# Patient Record
Sex: Male | Born: 1990 | Hispanic: No | Marital: Married | Smoking: Never smoker
Health system: Southern US, Community
[De-identification: ages and names within clinical notes are randomized; demographics above are authoritative.]

---

## 2020-03-12 ENCOUNTER — Emergency Department (HOSPITAL_COMMUNITY)
Admission: EM | Admit: 2020-03-12 | Discharge: 2020-03-13 | Disposition: A | Discharge status: Home / Self Care | Payer: PRIVATE HEALTH INSURANCE | Attending: Emergency Medicine | Admitting: Emergency Medicine

## 2020-03-12 ENCOUNTER — Encounter (HOSPITAL_COMMUNITY): Payer: Self-pay | Admitting: Emergency Medicine

## 2020-03-12 ENCOUNTER — Other Ambulatory Visit: Payer: Self-pay

## 2020-03-12 DIAGNOSIS — J0140 Acute pansinusitis, unspecified: Secondary | ICD-10-CM | POA: Diagnosis not present

## 2020-03-12 DIAGNOSIS — R519 Headache, unspecified: Secondary | ICD-10-CM | POA: Diagnosis present

## 2020-03-12 DIAGNOSIS — Z20822 Contact with and (suspected) exposure to covid-19: Secondary | ICD-10-CM | POA: Insufficient documentation

## 2020-03-12 LAB — URINALYSIS, ROUTINE W REFLEX MICROSCOPIC
Bilirubin Urine: NEGATIVE
Glucose, UA: NEGATIVE mg/dL
Hgb urine dipstick: NEGATIVE
Ketones, ur: NEGATIVE mg/dL
Leukocytes,Ua: NEGATIVE
Nitrite: NEGATIVE
Protein, ur: NEGATIVE mg/dL
Specific Gravity, Urine: 1.009 (ref 1.005–1.030)
pH: 8 (ref 5.0–8.0)

## 2020-03-12 LAB — CBC WITH DIFFERENTIAL/PLATELET
Abs Immature Granulocytes: 0.01 10*3/uL (ref 0.00–0.07)
Basophils Absolute: 0 10*3/uL (ref 0.0–0.1)
Basophils Relative: 1 %
Eosinophils Absolute: 0.1 10*3/uL (ref 0.0–0.5)
Eosinophils Relative: 2 %
HCT: 39 % (ref 39.0–52.0)
Hemoglobin: 12.8 g/dL — ABNORMAL LOW (ref 13.0–17.0)
Immature Granulocytes: 0 %
Lymphocytes Relative: 33 %
Lymphs Abs: 1.6 10*3/uL (ref 0.7–4.0)
MCH: 30.9 pg (ref 26.0–34.0)
MCHC: 32.8 g/dL (ref 30.0–36.0)
MCV: 94.2 fL (ref 80.0–100.0)
Monocytes Absolute: 0.5 10*3/uL (ref 0.1–1.0)
Monocytes Relative: 10 %
Neutro Abs: 2.7 10*3/uL (ref 1.7–7.7)
Neutrophils Relative %: 54 %
Platelets: 199 10*3/uL (ref 150–400)
RBC: 4.14 MIL/uL — ABNORMAL LOW (ref 4.22–5.81)
RDW: 12.1 % (ref 11.5–15.5)
WBC: 5 10*3/uL (ref 4.0–10.5)
nRBC: 0 % (ref 0.0–0.2)

## 2020-03-12 LAB — BASIC METABOLIC PANEL
Anion gap: 8 (ref 5–15)
BUN: 11 mg/dL (ref 6–20)
CO2: 28 mmol/L (ref 22–32)
Calcium: 9.4 mg/dL (ref 8.9–10.3)
Chloride: 102 mmol/L (ref 98–111)
Creatinine, Ser: 1.05 mg/dL (ref 0.61–1.24)
GFR calc Af Amer: >60 mL/min (ref >60)
GFR calc non Af Amer: >60 mL/min (ref >60)
Glucose, Bld: 117 mg/dL — ABNORMAL HIGH (ref 70–99)
Potassium: 3.7 mmol/L (ref 3.5–5.1)
Sodium: 138 mmol/L (ref 135–145)

## 2020-03-12 NOTE — ED Triage Notes (Signed)
Patient reports headache with low grade fever onset yesterday , denies head injury , patient added transient low back pain /legs pain this week . Denies neck stiffness or blurred vision .

## 2020-03-13 DIAGNOSIS — J0140 Acute pansinusitis, unspecified: Secondary | ICD-10-CM | POA: Diagnosis not present

## 2020-03-13 LAB — POC SARS CORONAVIRUS 2 AG -  ED: SARS Coronavirus 2 Ag: NEGATIVE

## 2020-03-13 MED ORDER — ONDANSETRON 4 MG PO TBDP: 4.0000 mg | ORAL_TABLET | Freq: Three times a day (TID) | ORAL | 0 refills | Status: AC | PRN

## 2020-03-13 MED ORDER — DIPHENHYDRAMINE HCL 50 MG/ML IJ SOLN
25.0000 mg | Freq: Once | INTRAMUSCULAR | Status: AC
  Administered 2020-03-13: 25 mg via INTRAVENOUS
  Filled 2020-03-13: qty 1

## 2020-03-13 MED ORDER — AMOXICILLIN-POT CLAVULANATE 875-125 MG PO TABS: 1.0000 | ORAL_TABLET | Freq: Two times a day (BID) | ORAL | 0 refills | Status: AC

## 2020-03-13 MED ORDER — PROCHLORPERAZINE EDISYLATE 10 MG/2ML IJ SOLN
10.0000 mg | Freq: Once | INTRAMUSCULAR | Status: AC
  Administered 2020-03-13: 10 mg via INTRAVENOUS
  Filled 2020-03-13: qty 2

## 2020-03-13 MED ORDER — SODIUM CHLORIDE 0.9 % IV BOLUS
500.0000 mL | Freq: Once | INTRAVENOUS | Status: AC
  Administered 2020-03-13: 01:00:00 500 mL via INTRAVENOUS

## 2020-03-13 NOTE — ED Notes (Signed)
RN Aundra Millet informed pt POC covid test neg

## 2020-03-13 NOTE — ED Provider Notes (Signed)
[facility] EMERGENCY DEPARTMENT Provider Note   CSN: [id number] Arrival date & time: 03/12/20  2035     History Chief Complaint  Patient presents with  . Headache    Christopher Knapp is a 29 y.o. male with no significant past medical history who presents to the emergency department with a chief complaint of headache.  The patient developed a constant, throbbing, global headache yesterday.  He took 1 tablet of Tylenol this morning with no improvement in his symptoms.  Headache is worse with positional changes.  He also notes that his face and scalp feel tender to the touch.  He reports a fever of 24 hours ago, but fever has since resolved.  He has been feeling nauseated.  No history of headaches.  Three days ago, the patient was having low back pain that radiated down his bilateral legs to his feet.  He took 9 tablets of Tylenol over the course of 24 hours and the pain resolved by the next day.  He did not have any urinary or fecal incontinence, numbness, weakness, dysuria, hematuria, abdominal pain, vomiting, or diarrhea associated with the back pain.  He denies neck pain or stiffness, visual changes, photophobia, sore throat, otalgia, nasal congestion, rhinorrhea, postnasal drip, dizziness, lightheadedness, chills, cough, shortness of breath, or chest pain.  He went to urgent care and had a COVID-19 test that was negative.  No known sick contacts.  Christopher Knapp was evaluated in Emergency Department on 03/13/2020 for the symptoms described in the history of present illness. He was evaluated in the context of the global COVID-19 pandemic, which necessitated consideration that the patient might be at risk for infection with the SARS-CoV-2 virus that causes COVID-19. Institutional protocols and algorithms that pertain to the evaluation of patients at risk for COVID-19 are in a state of rapid change based on information released by regulatory bodies including the CDC  and federal and state organizations. These policies and algorithms were followed during the patient's care in the ED.   The history is provided by the patient. No language interpreter was used.       History reviewed. No pertinent past medical history.  There are no problems to display for this patient.   History reviewed. No pertinent surgical history.     No family history on file.  Social History   Tobacco Use  . Smoking status: Never Smoker  . Smokeless tobacco: Never Used  Substance Use Topics  . Alcohol use: Never  . Drug use: Never    Home Medications Prior to Admission medications   Medication Sig Start Date End Date Taking? Authorizing Provider  amoxicillin-clavulanate (AUGMENTIN) 875-125 MG tablet Take 1 tablet by mouth every 12 (twelve) hours for 10 days. 03/13/20 03/23/20  Leydy Worthey A, PA-C  ondansetron (ZOFRAN ODT) 4 MG disintegrating tablet Take 1 tablet (4 mg total) by mouth every 8 (eight) hours as needed. 03/13/20   Caleigh Rabelo A, PA-C    Allergies    Patient has no known allergies.  Review of Systems   Review of Systems  Constitutional: Positive for fever. Negative for appetite change and chills.  HENT: Positive for sinus pain. Negative for congestion, ear pain, facial swelling, postnasal drip and sore throat.   Eyes: Negative for visual disturbance.  Respiratory: Negative for shortness of breath and wheezing.   Cardiovascular: Negative for chest pain.  Gastrointestinal: Positive for nausea. Negative for abdominal pain, blood in stool, constipation, diarrhea and vomiting.  Genitourinary: Negative  for dysuria.  Musculoskeletal: Positive for back pain (resolved). Negative for arthralgias, myalgias, neck pain and neck stiffness.  Skin: Negative for rash.  Allergic/Immunologic: Negative for immunocompromised state.  Neurological: Positive for headaches. Negative for dizziness, weakness, light-headedness and numbness.  Psychiatric/Behavioral: Negative  for confusion.    Physical Exam Updated Vital Signs BP 129/78 (BP Location: Right Arm)   Pulse 90   Temp 99.1 F (37.3 C) (Oral)   Resp 18   Ht 5\' 4"  (1.626 m)   Wt 90 kg   SpO2 96%   BMI 34.06 kg/m   Physical Exam Vitals and nursing note reviewed.  Constitutional:      General: He is not in acute distress.    Appearance: He is well-developed. He is not ill-appearing, toxic-appearing or diaphoretic.  HENT:     Head: Normocephalic.     Comments: Tender to palpation to the bilateral frontal and maxillary sinuses.  He also endorses pain with palpation to the scalp and posterior neck.  There is no erythema or warmth.  He has mastoid tenderness on the right.  Right TM with a purulent effusion and erythema.  Middle ear effusion of the left TM.  No pre or postauricular lymphadenopathy, occipital lymphadenopathy, or anterior cervical lymphadenopathy.  Posterior oropharynx is unremarkable.    Nose: Nasal tenderness and mucosal edema present. No congestion or rhinorrhea.     Right Nostril: No septal hematoma.     Left Nostril: No septal hematoma.     Right Turbinates: Enlarged and swollen.     Left Turbinates: Enlarged and swollen.     Right Sinus: Maxillary sinus tenderness and frontal sinus tenderness present.     Left Sinus: Maxillary sinus tenderness and frontal sinus tenderness present.  Eyes:     Extraocular Movements: Extraocular movements intact.     Conjunctiva/sclera: Conjunctivae normal.     Pupils: Pupils are equal, round, and reactive to light.  Cardiovascular:     Rate and Rhythm: Normal rate and regular rhythm.     Pulses: Normal pulses.     Heart sounds: Normal heart sounds. No murmur. No friction rub. No gallop.      Comments: No meningismus. Pulmonary:     Effort: Pulmonary effort is normal. No respiratory distress.     Breath sounds: No stridor. No wheezing, rhonchi or rales.  Chest:     Chest wall: No tenderness.  Abdominal:     General: There is no distension.      Palpations: Abdomen is soft. There is no mass.     Tenderness: There is no abdominal tenderness. There is no right CVA tenderness, left CVA tenderness, guarding or rebound.     Hernia: No hernia is present.  Musculoskeletal:     Cervical back: Neck supple.     Right lower leg: No edema.     Left lower leg: No edema.     Comments: No tenderness to the cervical, thoracic, or lumbar spinous processes or bilateral paraspinal muscles.  Skin:    General: Skin is warm and dry.  Neurological:     Mental Status: He is alert.     Comments: Alert and oriented x4.  GCS 15.  Cranial nerves II through XII are grossly intact.  5-5 strength against resistance of the bilateral upper and lower extremities.  Sensation is intact throughout.  No pronator drift.  Negative Romberg.  Gait is not ataxic.  No dysmetria with finger-to-nose bilaterally.  Psychiatric:        Behavior:  Behavior normal.     ED Results / Procedures / Treatments   Labs (all labs ordered are listed, but only abnormal results are displayed) Labs Reviewed  CBC WITH DIFFERENTIAL/PLATELET - Abnormal; Notable for the following components:      Result Value   RBC 4.14 (*)    Hemoglobin 12.8 (*)    All other components within normal limits  BASIC METABOLIC PANEL - Abnormal; Notable for the following components:   Glucose, Bld 117 (*)    All other components within normal limits  URINALYSIS, ROUTINE W REFLEX MICROSCOPIC - Abnormal; Notable for the following components:   Color, Urine STRAW (*)    All other components within normal limits  POC SARS CORONAVIRUS 2 AG -  ED    EKG None  Radiology No results found.  Procedures Procedures (including critical care time)  Medications Ordered in ED Medications  prochlorperazine (COMPAZINE) injection 10 mg (10 mg Intravenous Given 03/13/20 0041)  sodium chloride 0.9 % bolus 500 mL (0 mLs Intravenous Stopped 03/13/20 0058)  diphenhydrAMINE (BENADRYL) injection 25 mg (25 mg Intravenous  Given 03/13/20 0041)    ED Course  I have reviewed the triage vital signs and the nursing notes.  Pertinent labs & imaging results that were available during my care of the patient were reviewed by me and considered in my medical decision making (see chart for details).    MDM Rules/Calculators/A&P                      29 year old male with no significant past medical history presenting with a headache, onset yesterday.  He had a fever yesterday, but this is since resolved.  He is endorsing tenderness to the touch to his face, head, the back of his neck.  He notes that he did have some low back pain that radiated down his bilateral legs 3 days ago that resolved with taking Tylenol.  No red flags associated with his back pain.  Vital signs are normal.  On exam, he has a purulent effusion of the right ear and erythema.  There is a middle ear effusion on the left.  He has some mastoid tenderness on the right.  He has no meningismus.  Bilateral turbinates are erythematous and edematous.  He has tenderness palpation of the bilateral frontal and maxillary sinuses.  Suspect acute pan sinusitis.  Also considered bacterial versus atypical meningitis.  However, he has no meningismus on exam.  He was treated with Compazine and Benadryl for his headache, and on reevaluation symptoms had resolved.  Also consider COVID-19, but COVID-19 test was negative.  Doubt bacterial pneumonia, complicated migraine, CVA.  The patient was discussed with Dr. Wyvonnia Dusky, attending physician.   We will discharge the patient with Augmentin.  He was given strict return precautions to the emergency department.  All questions answered.  We will also provided patient with a referral to ENT if symptoms do not significantly improve within the next week.  He is hemodynamically stable to no acute distress.  Safe for discharge to home with outpatient follow-up as indicated.  Final Clinical Impression(s) / ED Diagnoses Final diagnoses:    Acute non-recurrent pansinusitis    Rx / DC Orders ED Discharge Orders         Ordered    amoxicillin-clavulanate (AUGMENTIN) 875-125 MG tablet  Every 12 hours     03/13/20 0208    ondansetron (ZOFRAN ODT) 4 MG disintegrating tablet  Every 8 hours PRN  03/13/20 0210           Frederik Pear A, PA-C 03/13/20 0220    Glynn Octave, MD 03/13/20 [id number]

## 2020-03-13 NOTE — ED Notes (Signed)
Patient verbalizes understanding of discharge instructions. Opportunity for questioning and answers were provided. Armband removed by staff, pt discharged from ED stable & ambulatory  

## 2020-03-13 NOTE — Discharge Instructions (Signed)
Thank you for allowing me to care for you today in the Emergency Department.   Your COVID-19 test was negative in the ER tonight.  Take 1 tablet of Augmentin by mouth 2 times daily for the next 10 days.  It is important to take the entire dose to avoid antibiotic resistance.  You may have some abdominal cramping or loose stools while taking this medication.  These are the 2 most common side effects of the antibiotic.  Take 650 mg of Tylenol or 600 mg of ibuprofen with food every 6 hours for pain.  You can alternate between these 2 medications every 3 hours if your pain returns.  For instance, you can take Tylenol at noon, followed by a dose of ibuprofen at 3, followed by second dose of Tylenol and 6.  Let 1 tablet of Zofran dissolve in your tongue every 8 hours as needed for nausea or vomiting.  You can follow-up with primary care or follow-up with the ear nose and throat physician if your symptoms do not significantly improve in the next week.  Return to the emergency department if you become unable to move your neck, develop confusion, if you develop severe redness or swelling around her eyes, if you develop uncontrollable vomiting, respiratory distress, or other new, concerning symptoms.

## 2020-03-25 ENCOUNTER — Other Ambulatory Visit: Payer: Self-pay | Admitting: Family Medicine

## 2020-03-25 DIAGNOSIS — R7401 Elevation of levels of liver transaminase levels: Secondary | ICD-10-CM

## 2020-03-31 ENCOUNTER — Other Ambulatory Visit: Payer: Self-pay

## 2020-04-06 ENCOUNTER — Ambulatory Visit
Admission: RE | Admit: 2020-04-06 | Discharge: 2020-04-06 | Disposition: A | Discharge status: Home / Self Care | Payer: PRIVATE HEALTH INSURANCE | Source: Ambulatory Visit | Attending: Family Medicine | Admitting: Family Medicine

## 2020-04-06 DIAGNOSIS — R7401 Elevation of levels of liver transaminase levels: Secondary | ICD-10-CM

## 2021-05-22 IMAGING — US US ABDOMEN COMPLETE
1 series · 14 of 25 positions shown · non-contrast
Comparison: None.

CLINICAL DATA: Elevated LFTs, no evidence of abdominal pain

EXAM:
ABDOMEN ULTRASOUND COMPLETE

[Series 1: us abdomen complete · 0.23mm/px · 14 of 85 slices shown]
[im 1/85]
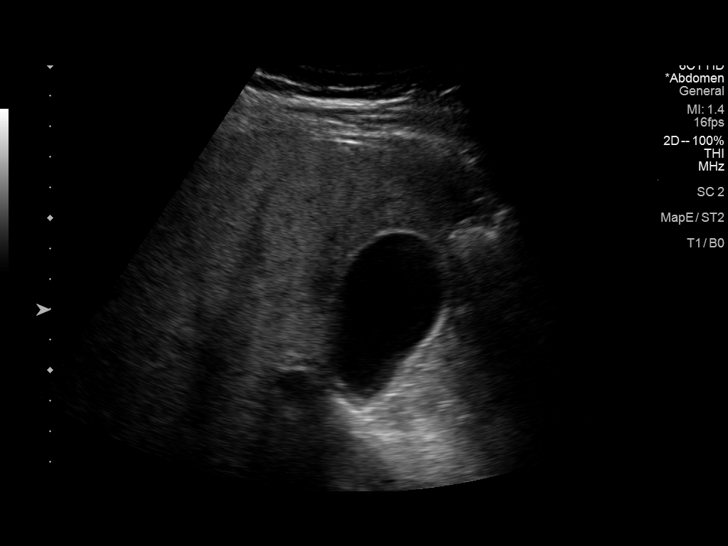
[im 8/85]
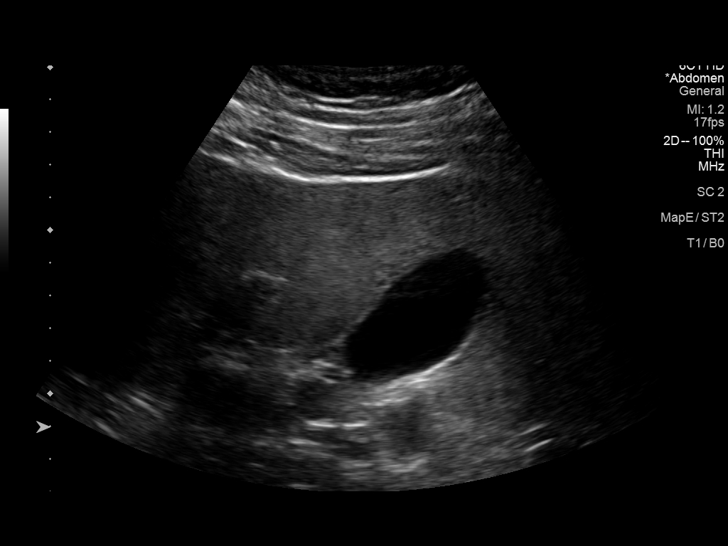
[im 15/85]
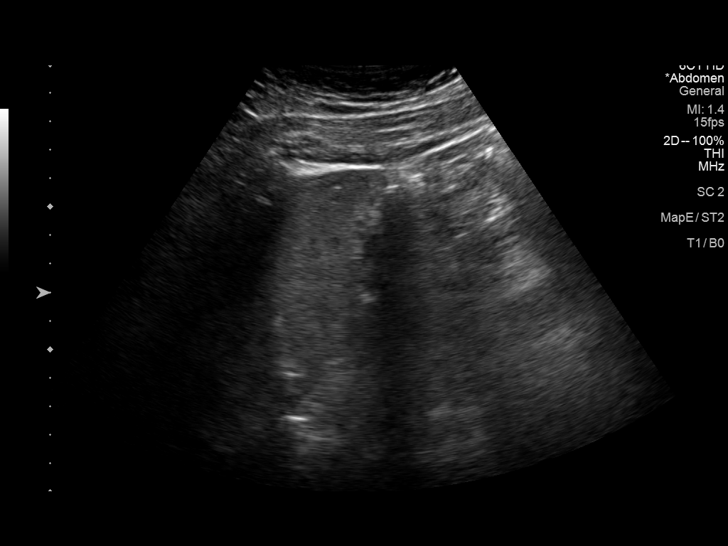
[im 22/85]
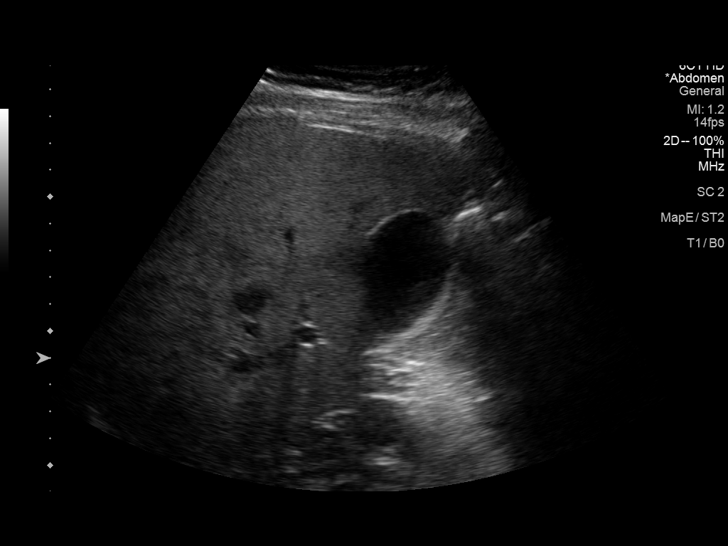
[im 29/85]
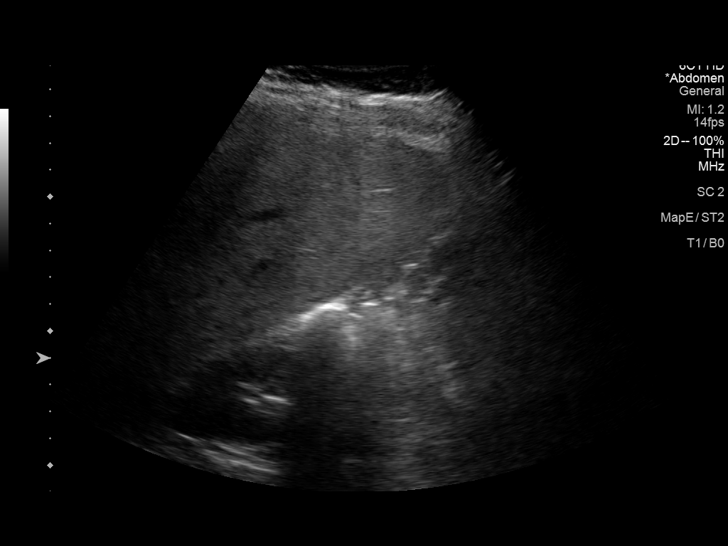
[im 32/85]
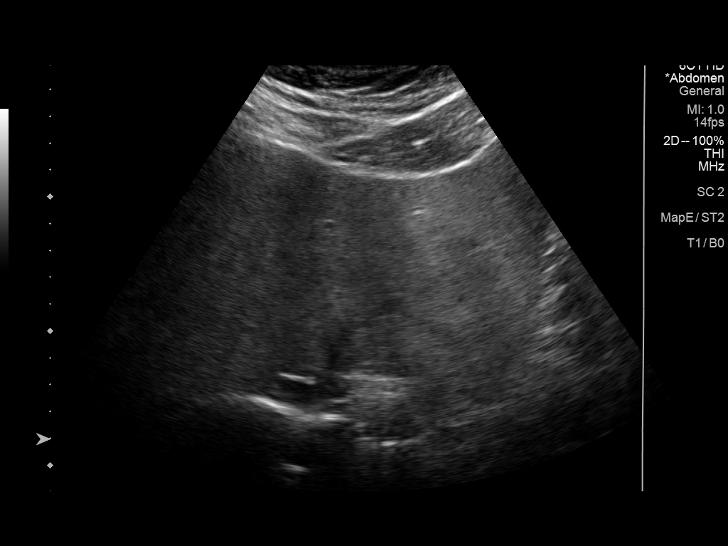
[im 39/85]
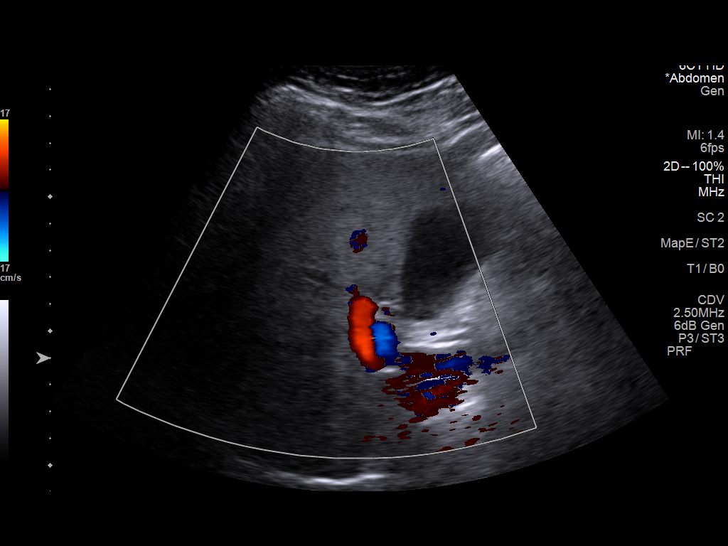
[im 46/85]
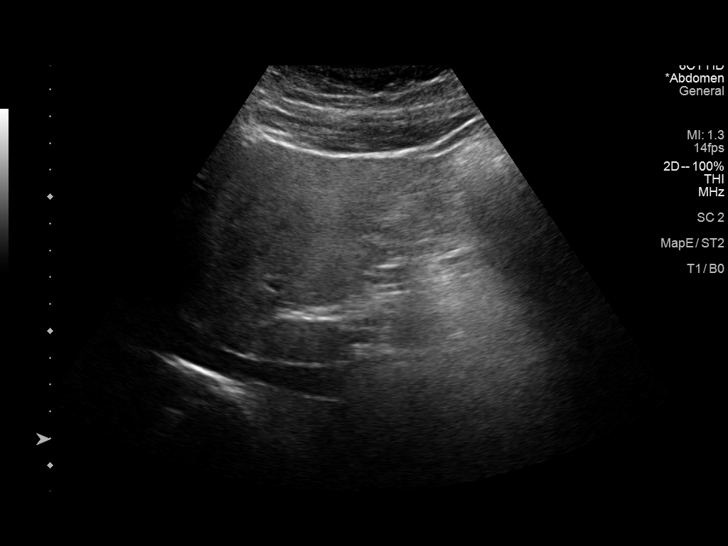
[im 53/85]
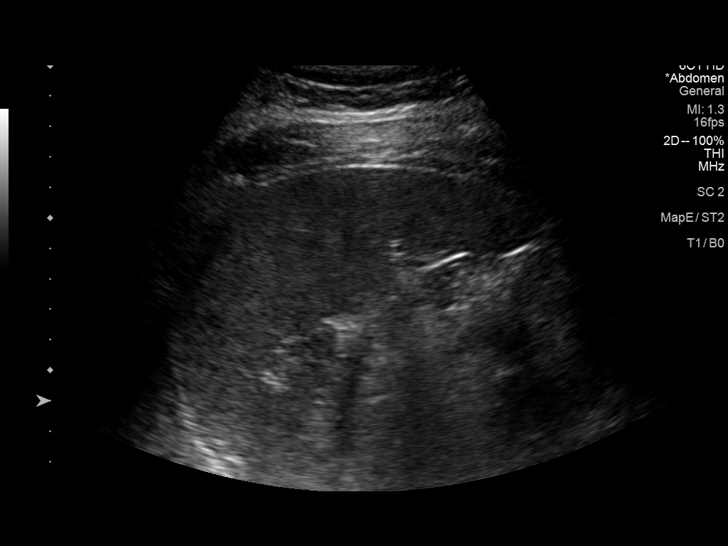
[im 57/85]
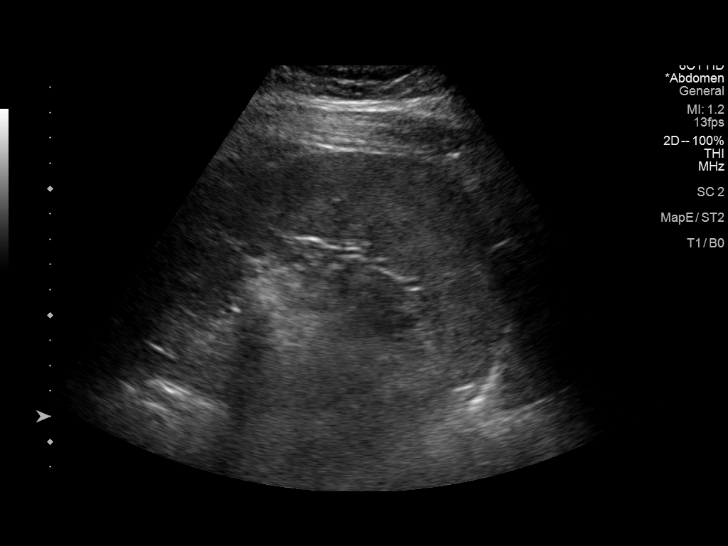
[im 64/85]
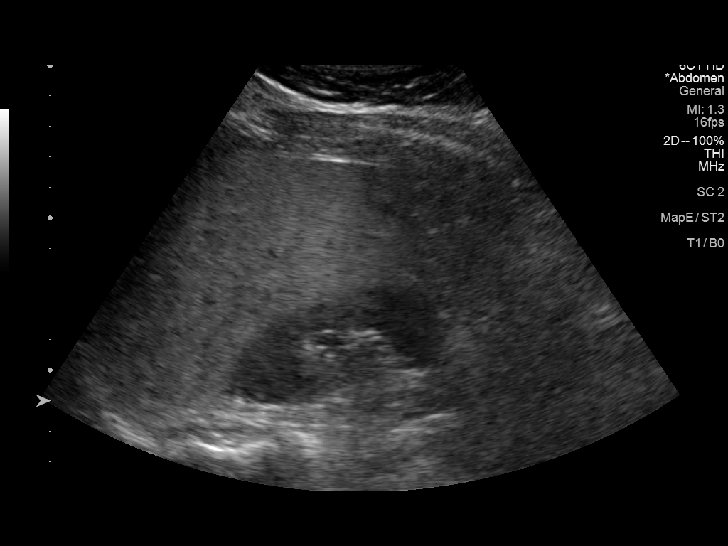
[im 71/85]
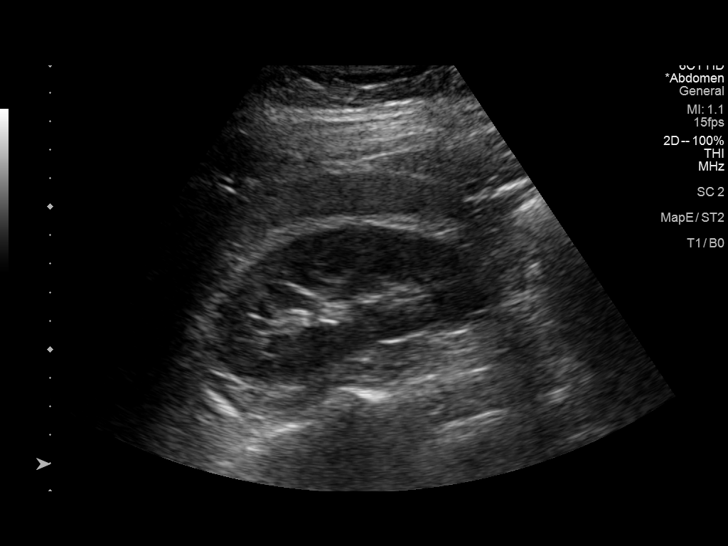
[im 78/85]
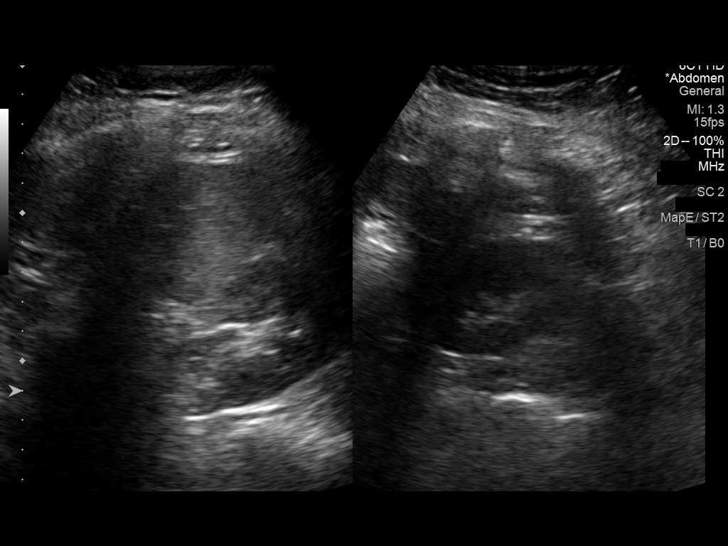
[im 85/85]
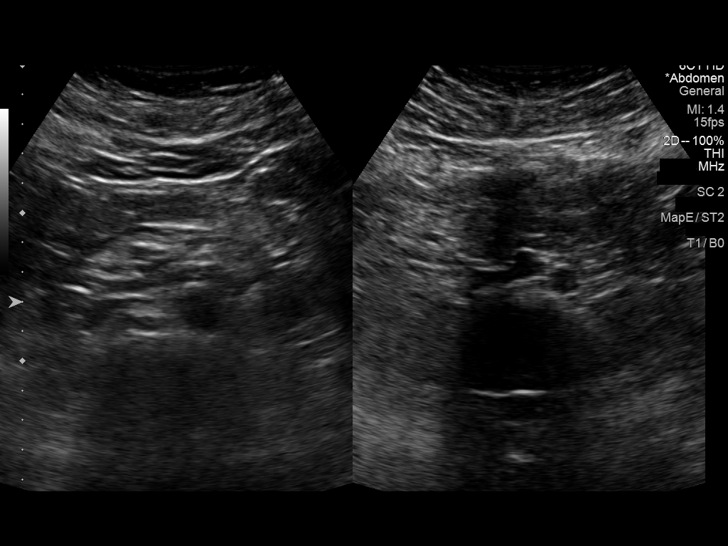

[14 of 25 positions shown; findings below may reference images not displayed]

FINDINGS: Gallbladder: No gallstones or wall thickening visualized. No
sonographic Murphy sign noted by sonographer.

Common bile duct: Diameter: 3.2 mm.

Liver: Diffuse heterogeneity is noted. This likely represents
underlying fatty infiltration. Area of focal fatty sparing is noted
adjacent to the gallbladder. Portal vein is patent on color Doppler
imaging with normal direction of blood flow towards the liver.

IVC: No abnormality visualized.

Pancreas: Visualized portion unremarkable.

Spleen: Size and appearance within normal limits.

Right Kidney: Length: 9.7 cm. Echogenicity within normal limits. No
mass or hydronephrosis visualized.

Left Kidney: Length: 10.4 cm. Echogenicity within normal limits. No
mass or hydronephrosis visualized.

Abdominal aorta: No aneurysm visualized.

Other findings: None.
IMPRESSION: Fatty liver.

No other focal abnormality is noted.
